# Patient Record
Sex: Female | Born: 1980 | Race: Black or African American | Hispanic: No | Marital: Married | Smoking: Never smoker
Health system: Southern US, Community
[De-identification: ages and names within clinical notes are randomized; demographics above are authoritative.]

## PROBLEM LIST (undated history)

## (undated) DIAGNOSIS — J40 Bronchitis, not specified as acute or chronic: Secondary | ICD-10-CM

## (undated) HISTORY — PX: DILATION AND CURETTAGE OF UTERUS: SHX78

## (undated) HISTORY — PX: CHOLECYSTECTOMY: SHX55

## (undated) HISTORY — PX: HERNIA REPAIR: SHX51

---

## 2002-06-13 ENCOUNTER — Inpatient Hospital Stay (HOSPITAL_COMMUNITY): Admission: AD | Admit: 2002-06-13 | Discharge: 2002-06-13 | Payer: Self-pay | Admitting: *Deleted

## 2002-07-11 ENCOUNTER — Inpatient Hospital Stay (HOSPITAL_COMMUNITY): Admission: AD | Admit: 2002-07-11 | Discharge: 2002-07-11 | Payer: Self-pay | Admitting: Family Medicine

## 2002-07-11 ENCOUNTER — Inpatient Hospital Stay (HOSPITAL_COMMUNITY): Admission: AD | Admit: 2002-07-11 | Discharge: 2002-07-11 | Payer: Self-pay | Admitting: Obstetrics and Gynecology

## 2002-07-25 ENCOUNTER — Inpatient Hospital Stay (HOSPITAL_COMMUNITY): Admission: AD | Admit: 2002-07-25 | Discharge: 2002-07-25 | Payer: Self-pay | Admitting: *Deleted

## 2002-07-31 ENCOUNTER — Inpatient Hospital Stay (HOSPITAL_COMMUNITY): Admission: AD | Admit: 2002-07-31 | Discharge: 2002-08-03 | Payer: Self-pay | Admitting: Family Medicine

## 2002-10-25 ENCOUNTER — Emergency Department (HOSPITAL_COMMUNITY): Admission: EM | Admit: 2002-10-25 | Discharge: 2002-10-25 | Payer: Self-pay | Admitting: Emergency Medicine

## 2003-03-01 ENCOUNTER — Emergency Department (HOSPITAL_COMMUNITY): Admission: EM | Admit: 2003-03-01 | Discharge: 2003-03-02 | Payer: Self-pay | Admitting: Emergency Medicine

## 2003-04-05 ENCOUNTER — Emergency Department (HOSPITAL_COMMUNITY): Admission: EM | Admit: 2003-04-05 | Discharge: 2003-04-06 | Payer: Self-pay | Admitting: Emergency Medicine

## 2003-04-06 ENCOUNTER — Ambulatory Visit (HOSPITAL_COMMUNITY): Admission: RE | Admit: 2003-04-06 | Discharge: 2003-04-06 | Payer: Self-pay

## 2003-04-19 ENCOUNTER — Ambulatory Visit (HOSPITAL_COMMUNITY): Admission: RE | Admit: 2003-04-19 | Discharge: 2003-04-20 | Payer: Self-pay | Admitting: General Surgery

## 2003-04-19 ENCOUNTER — Encounter: Payer: Self-pay | Admitting: General Surgery

## 2003-04-19 ENCOUNTER — Encounter (INDEPENDENT_AMBULATORY_CARE_PROVIDER_SITE_OTHER): Payer: Self-pay | Admitting: *Deleted

## 2004-05-02 ENCOUNTER — Encounter: Admission: RE | Admit: 2004-05-02 | Discharge: 2004-05-02 | Payer: Self-pay | Admitting: Physical Therapy

## 2004-08-22 ENCOUNTER — Emergency Department (HOSPITAL_COMMUNITY): Admission: EM | Admit: 2004-08-22 | Discharge: 2004-08-23 | Payer: Self-pay | Admitting: Emergency Medicine

## 2005-09-24 ENCOUNTER — Emergency Department (HOSPITAL_COMMUNITY): Admission: EM | Admit: 2005-09-24 | Discharge: 2005-09-25 | Payer: Self-pay | Admitting: Emergency Medicine

## 2008-01-06 ENCOUNTER — Ambulatory Visit (HOSPITAL_COMMUNITY): Admission: RE | Admit: 2008-01-06 | Discharge: 2008-01-06 | Payer: Self-pay | Admitting: Obstetrics and Gynecology

## 2008-07-29 ENCOUNTER — Ambulatory Visit: Admission: RE | Admit: 2008-07-29 | Discharge: 2008-07-29 | Payer: Self-pay | Admitting: Obstetrics & Gynecology

## 2008-07-30 ENCOUNTER — Ambulatory Visit (HOSPITAL_COMMUNITY): Admission: RE | Admit: 2008-07-30 | Discharge: 2008-07-30 | Payer: Self-pay | Admitting: Obstetrics and Gynecology

## 2008-07-30 ENCOUNTER — Encounter (INDEPENDENT_AMBULATORY_CARE_PROVIDER_SITE_OTHER): Payer: Self-pay | Admitting: Obstetrics and Gynecology

## 2008-09-14 ENCOUNTER — Inpatient Hospital Stay (HOSPITAL_COMMUNITY): Admission: AD | Admit: 2008-09-14 | Discharge: 2008-09-14 | Payer: Self-pay | Admitting: Obstetrics

## 2009-01-18 ENCOUNTER — Emergency Department (HOSPITAL_BASED_OUTPATIENT_CLINIC_OR_DEPARTMENT_OTHER): Admission: EM | Admit: 2009-01-18 | Discharge: 2009-01-18 | Payer: Self-pay | Admitting: Emergency Medicine

## 2009-03-26 ENCOUNTER — Inpatient Hospital Stay (HOSPITAL_COMMUNITY): Admission: AD | Admit: 2009-03-26 | Discharge: 2009-03-26 | Payer: Self-pay | Admitting: Obstetrics

## 2009-07-29 ENCOUNTER — Inpatient Hospital Stay (HOSPITAL_COMMUNITY): Admission: AD | Admit: 2009-07-29 | Discharge: 2009-07-29 | Payer: Self-pay | Admitting: Obstetrics and Gynecology

## 2009-09-29 ENCOUNTER — Inpatient Hospital Stay (HOSPITAL_COMMUNITY): Admission: AD | Admit: 2009-09-29 | Discharge: 2009-09-30 | Payer: Self-pay | Admitting: Obstetrics and Gynecology

## 2009-09-29 ENCOUNTER — Ambulatory Visit: Payer: Self-pay | Admitting: Advanced Practice Midwife

## 2009-10-01 ENCOUNTER — Inpatient Hospital Stay (HOSPITAL_COMMUNITY): Admission: AD | Admit: 2009-10-01 | Discharge: 2009-10-02 | Payer: Self-pay | Admitting: Obstetrics and Gynecology

## 2009-10-27 ENCOUNTER — Inpatient Hospital Stay (HOSPITAL_COMMUNITY): Admission: AD | Admit: 2009-10-27 | Discharge: 2009-10-27 | Payer: Self-pay | Admitting: Obstetrics and Gynecology

## 2009-11-04 ENCOUNTER — Inpatient Hospital Stay (HOSPITAL_COMMUNITY): Admission: AD | Admit: 2009-11-04 | Discharge: 2009-11-04 | Payer: Self-pay | Admitting: Obstetrics and Gynecology

## 2009-11-07 ENCOUNTER — Inpatient Hospital Stay (HOSPITAL_COMMUNITY): Admission: AD | Admit: 2009-11-07 | Discharge: 2009-11-09 | Payer: Self-pay | Admitting: Obstetrics & Gynecology

## 2010-06-18 ENCOUNTER — Emergency Department (HOSPITAL_BASED_OUTPATIENT_CLINIC_OR_DEPARTMENT_OTHER): Admission: EM | Admit: 2010-06-18 | Discharge: 2010-06-18 | Payer: Self-pay | Admitting: Emergency Medicine

## 2010-07-27 ENCOUNTER — Ambulatory Visit: Payer: Self-pay | Admitting: Diagnostic Radiology

## 2010-07-27 ENCOUNTER — Emergency Department (HOSPITAL_BASED_OUTPATIENT_CLINIC_OR_DEPARTMENT_OTHER): Admission: EM | Admit: 2010-07-27 | Discharge: 2010-07-27 | Payer: Self-pay | Admitting: Emergency Medicine

## 2010-11-28 LAB — BASIC METABOLIC PANEL
BUN: 9 mg/dL (ref 6–23)
Calcium: 9 mg/dL (ref 8.4–10.5)
GFR calc non Af Amer: >60 mL/min (ref >60)
Glucose, Bld: 88 mg/dL (ref 70–99)
Sodium: 146 mEq/L — ABNORMAL HIGH (ref 135–145)

## 2010-11-28 LAB — DIFFERENTIAL
Basophils Absolute: 0.1 10*3/uL (ref 0.0–0.1)
Basophils Relative: 1 % (ref 0–1)
Neutro Abs: 4.2 10*3/uL (ref 1.7–7.7)
Neutrophils Relative %: 55 % (ref 43–77)

## 2010-11-28 LAB — URINALYSIS, ROUTINE W REFLEX MICROSCOPIC
Glucose, UA: NEGATIVE mg/dL
Ketones, ur: NEGATIVE mg/dL
Nitrite: NEGATIVE
Protein, ur: NEGATIVE mg/dL

## 2010-11-28 LAB — D-DIMER, QUANTITATIVE: D-Dimer, Quant: 0.26 ug/mL-FEU (ref 0.00–0.48)

## 2010-11-28 LAB — PREGNANCY, URINE: Preg Test, Ur: NEGATIVE

## 2010-11-28 LAB — CBC
MCHC: 33.5 g/dL (ref 30.0–36.0)
RDW: 11.7 % (ref 11.5–15.5)

## 2010-12-03 LAB — STREP B DNA PROBE

## 2010-12-03 LAB — URINALYSIS, ROUTINE W REFLEX MICROSCOPIC
Bilirubin Urine: NEGATIVE
Hgb urine dipstick: NEGATIVE
Nitrite: NEGATIVE
Nitrite: NEGATIVE
Specific Gravity, Urine: 1.015 (ref 1.005–1.030)
Specific Gravity, Urine: <1.005 — ABNORMAL LOW (ref 1.005–1.030)
Urobilinogen, UA: 0.2 mg/dL (ref 0.0–1.0)
pH: 5.5 (ref 5.0–8.0)

## 2010-12-06 LAB — COMPREHENSIVE METABOLIC PANEL
ALT: 12 U/L (ref 0–35)
AST: 19 U/L (ref 0–37)
Alkaline Phosphatase: 249 U/L — ABNORMAL HIGH (ref 39–117)
CO2: 21 mEq/L (ref 19–32)
Chloride: 108 mEq/L (ref 96–112)
GFR calc Af Amer: >60 mL/min (ref >60)
GFR calc non Af Amer: >60 mL/min (ref >60)
Potassium: 3.6 mEq/L (ref 3.5–5.1)
Sodium: 137 mEq/L (ref 135–145)
Total Bilirubin: 0.8 mg/dL (ref 0.3–1.2)

## 2010-12-06 LAB — URINALYSIS, DIPSTICK ONLY
Ketones, ur: NEGATIVE mg/dL
Leukocytes, UA: NEGATIVE
Protein, ur: NEGATIVE mg/dL
Urobilinogen, UA: 0.2 mg/dL (ref 0.0–1.0)

## 2010-12-06 LAB — CBC
HCT: 36.3 % (ref 36.0–46.0)
MCHC: 33.5 g/dL (ref 30.0–36.0)
MCV: 83.1 fL (ref 78.0–100.0)
MCV: 83.7 fL (ref 78.0–100.0)
MCV: 83.8 fL (ref 78.0–100.0)
Platelets: 180 10*3/uL (ref 150–400)
Platelets: 214 10*3/uL (ref 150–400)
RBC: 4.48 MIL/uL (ref 3.87–5.11)
RDW: 14.3 % (ref 11.5–15.5)
WBC: 10.9 10*3/uL — ABNORMAL HIGH (ref 4.0–10.5)
WBC: 9.5 10*3/uL (ref 4.0–10.5)

## 2010-12-06 LAB — RPR: RPR Ser Ql: NONREACTIVE

## 2010-12-24 LAB — CBC
Hemoglobin: 12.9 g/dL (ref 12.0–15.0)
MCHC: 34.1 g/dL (ref 30.0–36.0)
RBC: 4.52 MIL/uL (ref 3.87–5.11)
WBC: 7.4 10*3/uL (ref 4.0–10.5)

## 2010-12-24 LAB — ABO/RH: ABO/RH(D): B POS

## 2010-12-25 IMAGING — US US TRANSVAGINAL NON-OB
1 series · 14 of 25 positions shown · non-contrast
Comparison: OB ultrasound from 07/29/2008.

CLINICAL DATA: Left lower quadrant pain.

TRANSABDOMINAL AND TRANSVAGINAL ULTRASOUND OF PELVIS
TECHNIQUE: Both transabdominal and transvaginal ultrasound
examinations of the pelvis were performed including evaluation of
the uterus, ovaries, adnexal regions, and pelvic cul-de-sac.

[Series 1: us transvaginal non-ob · 14 of 37 slices shown]
[im 1/37]
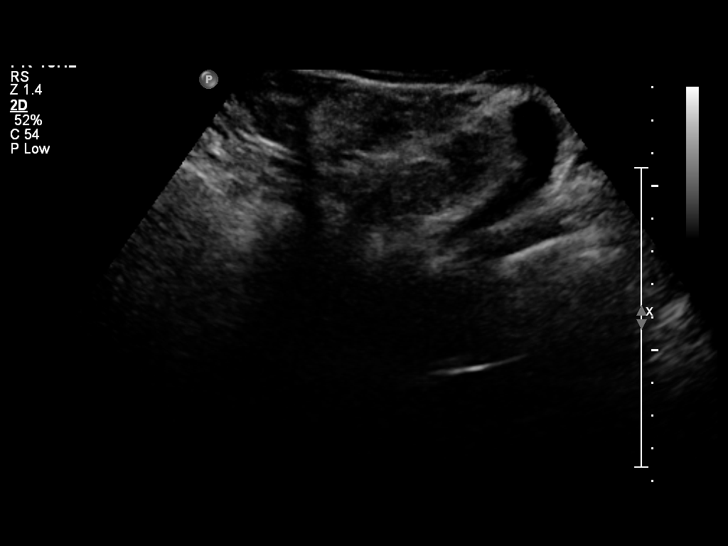
[im 4/37]
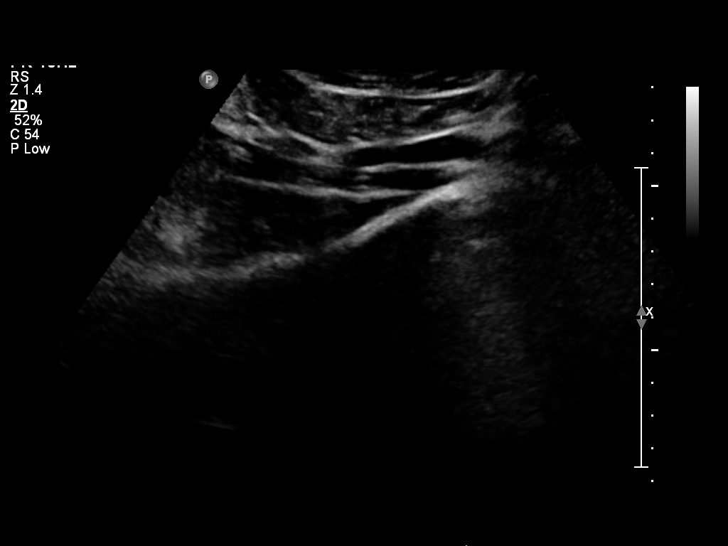
[im 7/37]
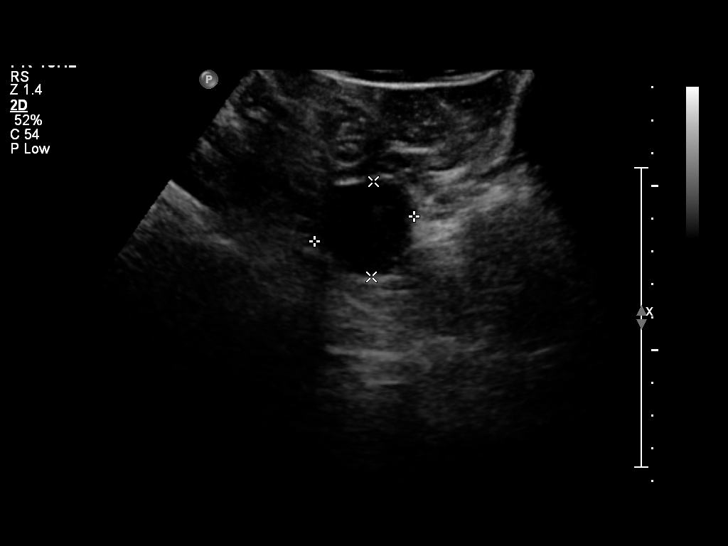
[im 10/37]
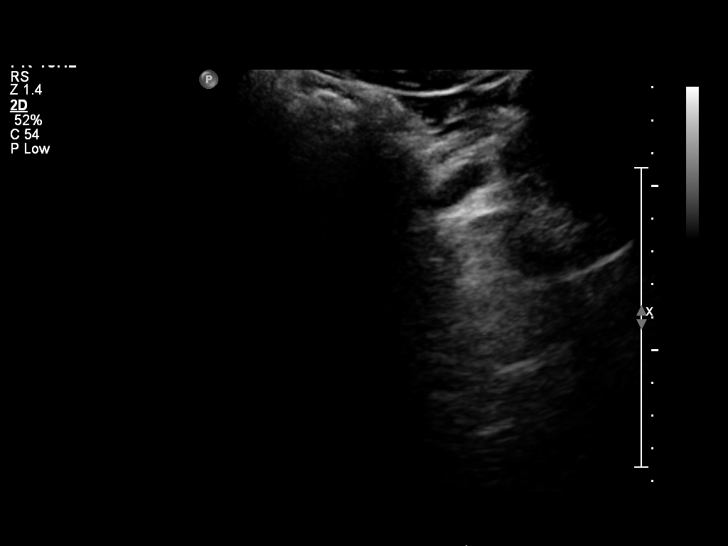
[im 13/37]
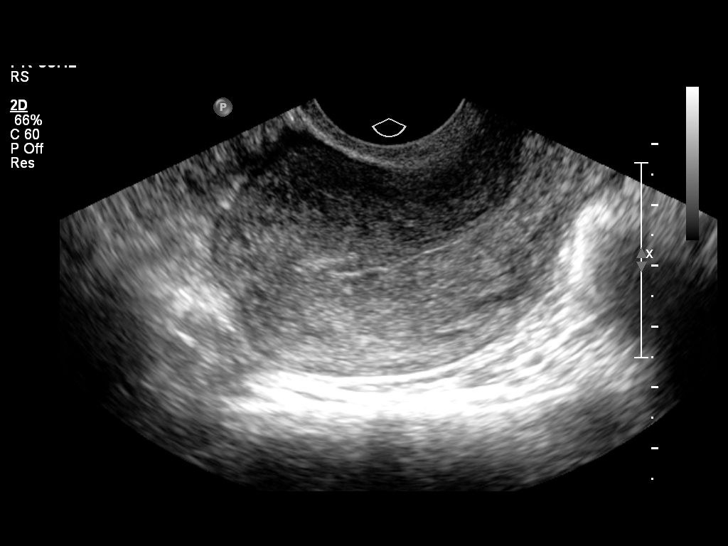
[im 14/37]
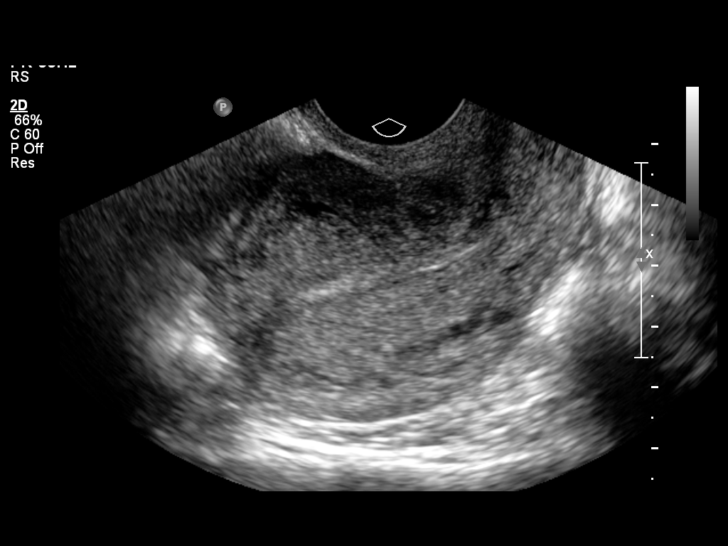
[im 17/37]
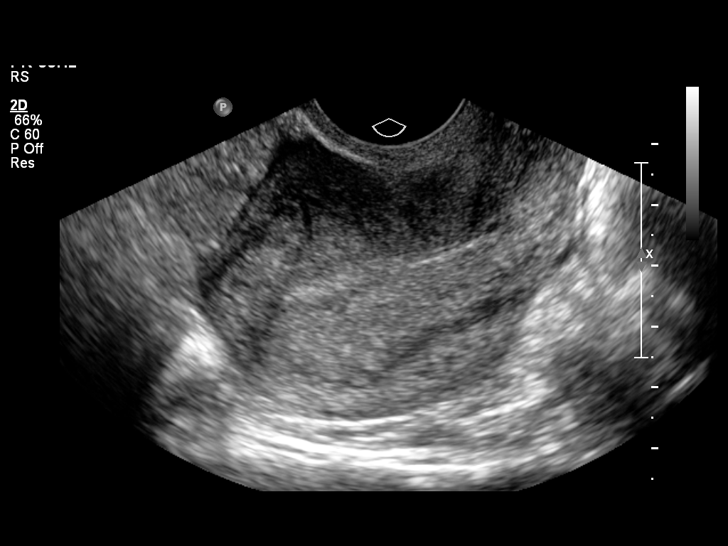
[im 20/37]
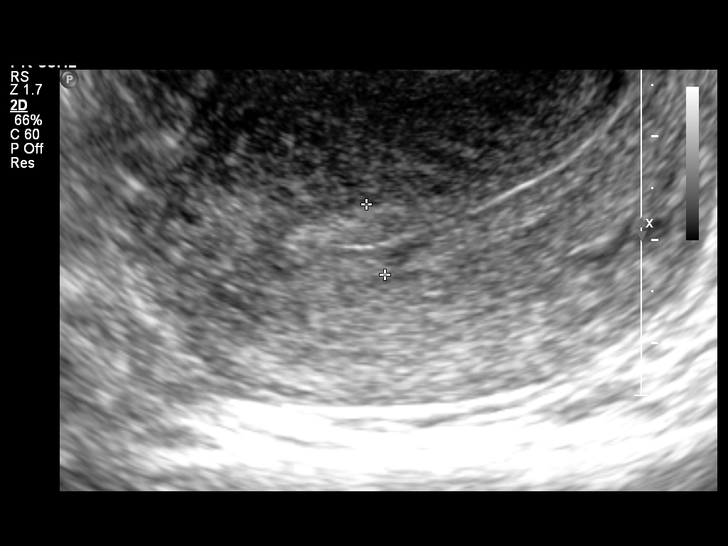
[im 23/37]
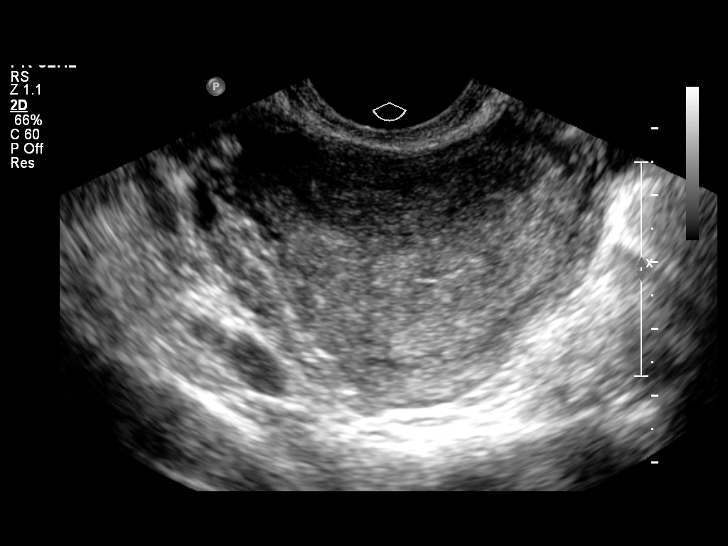
[im 25/37]
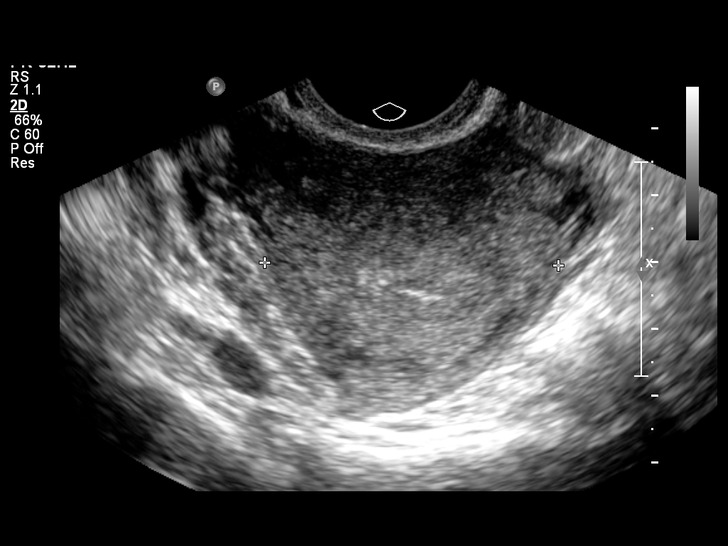
[im 28/37]
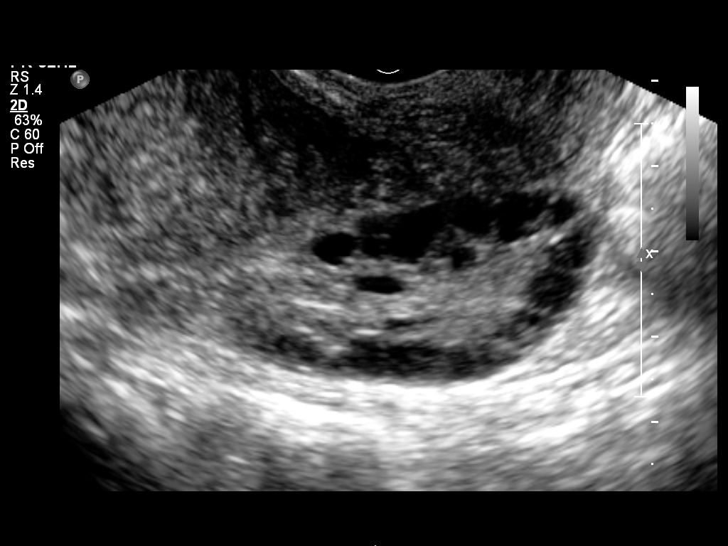
[im 31/37]
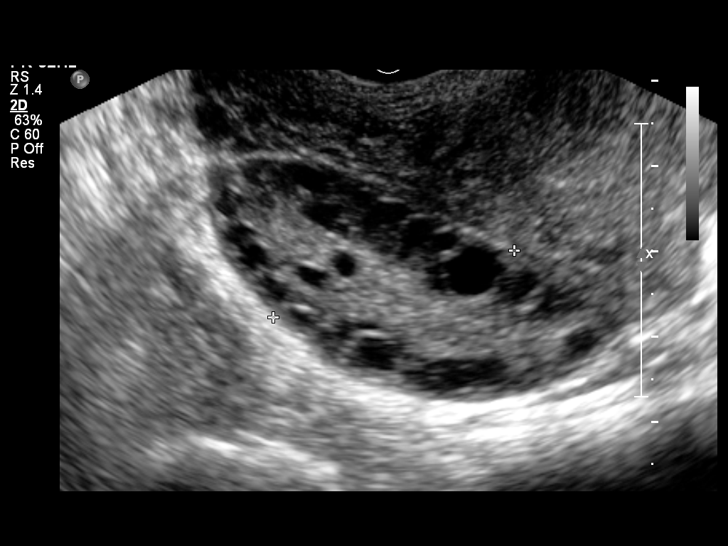
[im 34/37]
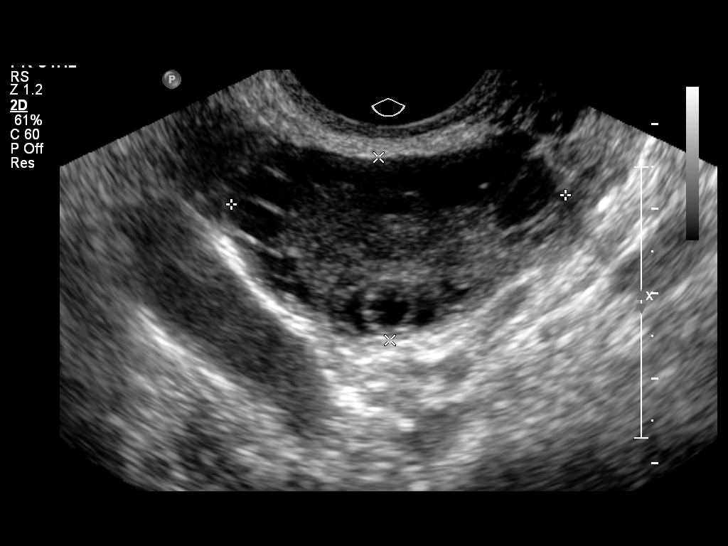
[im 37/37]
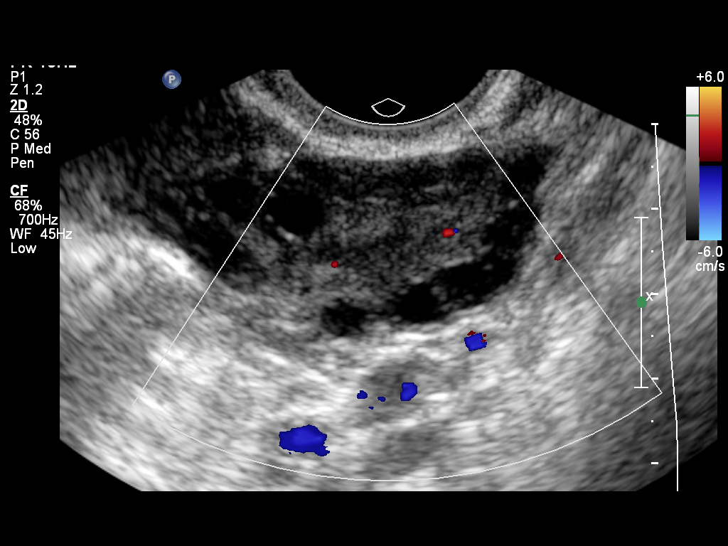

[14 of 25 positions shown; findings below may reference images not displayed]

FINDINGS: The uterus measures 6.9 x 3.9 x 4.4 cm.  Myometrial echotexture is
homogeneous.  No evidence for fibroids.  Endometrial stripe
thickness is 7 mm.

The right ovary measures 5.0 x 2.2 x 2.9 cm.  The left ovary
measures 4.0 x 2.2 x 3.4 cm.  Both ovaries have peripherally
oriented follicles, but show no evidence for increased volume.  No
evidence for free fluid in the cul-de-sac .
IMPRESSION: Normal uterus.

Both ovaries show peripheral orientation of the follicles, but no
increase in ovarian volume.

## 2010-12-26 LAB — URINALYSIS, ROUTINE W REFLEX MICROSCOPIC
Hgb urine dipstick: NEGATIVE
Nitrite: NEGATIVE
Specific Gravity, Urine: 1.01 (ref 1.005–1.030)
Urobilinogen, UA: 0.2 mg/dL (ref 0.0–1.0)

## 2011-01-30 NOTE — Op Note (Signed)
[name], Nancy Black NO.:  [id number]   MEDICAL RECORD NO.:  [id number]          PATIENT TYPE:  AMB   LOCATION:  SDC                           FACILITY:  [facility]   PHYSICIAN:  Lenoard Aden, M.D.DATE OF BIRTH:  [birth year]   DATE OF PROCEDURE:  07/30/2008  DATE OF DISCHARGE:                               OPERATIVE REPORT   PREOPERATIVE DIAGNOSIS:  Missed abortion at 9 weeks.   POSTOPERATIVE DIAGNOSIS:  Missed abortion at 9 weeks.   PROCEDURE:  Suction dilatation and evacuation.   SURGEON:  Lenoard Aden, MD.   ANESTHESIA:  MAC paracervical.   ESTIMATED BLOOD LOSS:  Less than 50 mL.   COMPLICATIONS:  None.   DRAINS:  None.   COUNTS:  Correct.   The patient was taken to recovery in good condition.   SPECIMEN:  Products of conception, sent to Pathology.   BRIEF OPERATIVE NOTE:  After being apprised of risks of anesthesia,  infection, bleeding, injury to abdominal organs, need for repair, and  delayed versus immediate complications to include bowel and bladder  injury, the patient was brought to the operating room where she was  administered IV sedation without difficulty, prepped and draped in usual  sterile fashion, catheterized till the bladder is empty.  Exam under  anesthesia revealed an 8- to 10-week anteflexed uterus and no adnexal  masses.  After achieving adequate anesthesia, Marcaine solution with  diluted Xylocaine solution placed 20 mL total, standard paracervical  block.  Cervix was easily dilated up to #25 [facility] dilator, and an 8-mm  curved suction curette placed.  Suction initiated and products of  conception aspirated without difficulty and confirmed repeat suction and  curettage and a four quadrant method reveals cavity to be empty.  Good  hemostasis noted.  All instruments were removed.  The patient tolerated  the procedure well, transferred to recovery in good condition.     Lenoard Aden, M.D.  Electronically Signed    RJT/MEDQ  D:  07/30/2008  T:  07/31/2008  Job:  [id number]

## 2011-02-02 NOTE — Discharge Summary (Signed)
   NAME:  Nancy Black, PANIK                        ACCOUNT NO.:  [id number]   MEDICAL RECORD NO.:  [id number]                   PATIENT TYPE:  INP   LOCATION:  9121                                 FACILITY:  [facility]   PHYSICIAN:  Tanya S. Shawnie Pons, M.D.                DATE OF BIRTH:  27-Jul-1981   DATE OF ADMISSION:  07/31/2002  DATE OF DISCHARGE:  08/03/2002                                 DISCHARGE SUMMARY   DISCHARGE DIAGNOSES:  53. A 30 year old G1, P1 status post spontaneous vaginal delivery of viable     female infant at term.  2. Anemia.  3. Fever during labor.   DISCHARGE MEDICATIONS:  1. Ibuprofen 600 mg q.6h. p.r.n.  2. Darvocet 100/650 mg q.4h. p.r.n. severe pain.  3. Ortho Lo-Estrin p.o. daily beginning two weeks following delivery.  4. Ferrous sulfate 325 mg daily.   FOLLOW UP:  Return to [facility] in six weeks.   HISTORY OF PRESENT ILLNESS:  Please see full H&P for details.  In brief,  patient is a 30 year old African-American female gravida 1, now para 1 who  presented to [facility] at [redacted] weeks gestational age in active labor.  She had received her prenatal care at [facility] in [facility] and her  prenatal course had been mostly uncomplicated except for some elevated blood  pressure.  Her blood pressure on admission was 155/82.  Laboratories  obtained on admission include WBC 11.7, hemoglobin 12.7, platelets 232,000.  Sodium 140, potassium 3.9, chloride 110, CO2 22, glucose 86, BUN 3,  creatinine 0.6, AST 13, ALT less than 19, alkaline phosphatase 303, total  bilirubin 0.4, LDH 166, and uric acid 4.8.  She was admitted to labor and  delivery for expectant management.   HOSPITAL COURSE:  She did well.  She received Amnioinfusion during labor  secondary to meconium stained fluid.  She received an epidural during labor  for pain.  Fetal heart tracing at times was noted to have decreased  reactivity, though fetal heart rate remained stable.   Her labor course was  further complicated by fever to 100.7 and she was started on IV ampicillin  and IV gentamicin for possible chorioamnionitis.  On August 01, 2002 she  delivered a viable female infant with Apgars of 5 and 9.  A midline episiotomy  was performed to assist with delivery.  Her postpartum course was  uncomplicated.  She remained afebrile.   DISCHARGE INSTRUCTIONS:  She was discharged to home in stable condition.     Tamika J. Lazarus Salines, M.D.                      Shelbie Proctor. Shawnie Pons, M.D.    Nadara Eaton  D:  01/21/2003  T:  01/21/2003  Job:  [id number]

## 2011-02-02 NOTE — Op Note (Signed)
NAME:  Nancy Black, Nancy Black                        ACCOUNT NO.:  [id number]   MEDICAL RECORD NO.:  [id number]                   PATIENT TYPE:  OIB   LOCATION:  NA                                   FACILITY:  [facility]   PHYSICIAN:  Gita Kudo, M.D.              DATE OF BIRTH:  08/24/1981   DATE OF PROCEDURE:  04/19/2003  DATE OF DISCHARGE:                                 OPERATIVE REPORT   PREOPERATIVE DIAGNOSIS:  Gallstones.   POSTOPERATIVE DIAGNOSIS:  Gallstones, normal-appearing cholangiogram.   OPERATION PERFORMED:  Laparoscopic cholecystectomy with intraoperative  cholangiogram.   SURGEON:  Gita Kudo, M.D.   ASSISTANT:  Sharlet Salina T. Hoxworth, M.D.   ANESTHESIA:  General endotracheal.   INDICATIONS FOR PROCEDURE:  The patient is a 29 year old female with bouts  of abdominal pain and gallbladder ultrasound showed stones.  Alkaline  phosphatase slightly elevated.   OPERATIVE FINDINGS:  The gallbladder was thin-walled, had some filmy  adhesions that appeared congenital rather than inflammatory.  The cystic  duct and artery looked normal in size.  The cholangiogram appeared normal.   DESCRIPTION OF PROCEDURE:  Under satisfactory general endotracheal  anesthesia, having received 1.0g Ancef preop, the patient's abdomen prepped  and draped in a standard fashion.  Through Marcaine infiltrated skin  incisions, the ports were placed.  In the midline below the umbilicus, the  peritoneum was entered through the midline and controlled with a figure-of-  eight sutures.  Operating Hasson port inserted and secured and good CO2  pneumoperitoneum established.  Under direct visualization, two #5 ports were  placed laterally and a second #10 port medially.  Graspers in the lateral  port gave good exposure and operating through the medial port, I carefully  dissected down all the adhesions and then circumferentially dissected the  cystic duct and artery.  When certain of the anatomy,  the cystic duct was  clipped close to the gallbladder and the cystic artery controlled with  multiple clips.  An incision made in the cystic duct and cholangiogram  catheter placed and x-rays obtained.  No obstruction or defects.  Cath  removed and the duct controlled with three clips distally and transected as  was the artery.  Gallbladder then removed from the liver bed using cautery  for dissection and hemostasis.  The operative bed was checked for hemostasis  by cautery, lavaged with saline and suctioned dry.   Camera moved to the upper port and a large grasper placed in the umbilical  port and used to retrieve the gallbladder intact, without spillage or  complication.  Then the operative site again checked, lavaged, suctioned  dry.  Ports were removed under direct vision and the CO2 released.  Midline  closed with a previous figure-of-eight and a second  interrupted 0 Vicryl suture.  Subcu approximated with 4-0 Vicryl and Steri-  Strips for the skin.  Sterile absorbent dressings applied and the patient  went to recovery room from the operating room in good condition without  complication.                                                Gita Kudo, M.D.    MRL/MEDQ  D:  04/19/2003  T:  04/19/2003  Job:  [id number]

## 2011-06-19 LAB — CBC
MCV: 84
RBC: 4.72
WBC: 10.8 — ABNORMAL HIGH

## 2011-06-21 LAB — URINALYSIS, ROUTINE W REFLEX MICROSCOPIC
Bilirubin Urine: NEGATIVE
Glucose, UA: NEGATIVE mg/dL
Hgb urine dipstick: NEGATIVE
Ketones, ur: NEGATIVE mg/dL
Nitrite: NEGATIVE
Protein, ur: NEGATIVE mg/dL
Specific Gravity, Urine: 1.02 (ref 1.005–1.030)
Urobilinogen, UA: 0.2 mg/dL (ref 0.0–1.0)
pH: 6 (ref 5.0–8.0)

## 2011-06-21 LAB — CBC
HCT: 38.2 % (ref 36.0–46.0)
Hemoglobin: 12.9 g/dL (ref 12.0–15.0)
MCHC: 33.9 g/dL (ref 30.0–36.0)
MCV: 84.2 fL (ref 78.0–100.0)
Platelets: 259 K/uL (ref 150–400)
RBC: 4.54 MIL/uL (ref 3.87–5.11)
RDW: 12.6 % (ref 11.5–15.5)
WBC: 8.9 K/uL (ref 4.0–10.5)

## 2011-06-21 LAB — URINE CULTURE: Colony Count: 35000

## 2015-06-17 ENCOUNTER — Encounter (HOSPITAL_COMMUNITY): Payer: Self-pay | Admitting: Emergency Medicine

## 2015-06-17 ENCOUNTER — Emergency Department (INDEPENDENT_AMBULATORY_CARE_PROVIDER_SITE_OTHER)
Admission: EM | Admit: 2015-06-17 | Discharge: 2015-06-17 | Disposition: A | Discharge status: Home / Self Care | Payer: Medicaid Other | Source: Home / Self Care | Attending: Emergency Medicine | Admitting: Emergency Medicine

## 2015-06-17 DIAGNOSIS — L509 Urticaria, unspecified: Secondary | ICD-10-CM

## 2015-06-17 DIAGNOSIS — L503 Dermatographic urticaria: Secondary | ICD-10-CM

## 2015-06-17 MED ORDER — HYDROXYZINE HCL 25 MG PO TABS: 25.0000 mg | ORAL_TABLET | Freq: Four times a day (QID) | ORAL | Status: DC | PRN

## 2015-06-17 MED ORDER — METHYLPREDNISOLONE ACETATE 80 MG/ML IJ SUSP
INTRAMUSCULAR | Status: AC
  Filled 2015-06-17: qty 1

## 2015-06-17 MED ORDER — CETIRIZINE HCL 10 MG PO TABS: 10.0000 mg | ORAL_TABLET | Freq: Every day | ORAL | Status: DC

## 2015-06-17 MED ORDER — METHYLPREDNISOLONE ACETATE 80 MG/ML IJ SUSP
80.0000 mg | Freq: Once | INTRAMUSCULAR | Status: AC
  Administered 2015-06-17: 80 mg via INTRAMUSCULAR

## 2015-06-17 NOTE — ED Notes (Signed)
Hives intermittently for 3 -4 weeks.  Unsure of cause.  Very little relief from benadryl.

## 2015-06-17 NOTE — ED Provider Notes (Signed)
CSN: [id number]     Arrival date & time 06/17/15  1851 History   First MD Initiated Contact with Patient 06/17/15 1940     Chief Complaint  Patient presents with  . Urticaria   (Consider location/radiation/quality/duration/timing/severity/associated sxs/prior Treatment) HPI  She is a 34 year old woman here for evaluation of hives. She states for the last 3 or 4 weeks she has had intermittent episodes of hives.  She states they'll pop up and then go away. They seem to be triggered by stress. She called her doctor, who recommended that she stop the Carafate. She had been on Carafate several months ago for stress-related GI issues. This medicine was refilled about a week before the hives started. She is not taking the Carafate for over a week without improvement. She states anytime she scratches her skin, hives will pop up. She has taken Benadryl without much improvement.  History reviewed. No pertinent past medical history. Past Surgical History  Procedure Laterality Date  . Hernia repair    . Cholecystectomy     No family history on file. Social History  Substance Use Topics  . Smoking status: Never Smoker   . Smokeless tobacco: None  . Alcohol Use: Yes   OB History    No data available     Review of Systems As in history of present illness Allergies  Codeine  Home Medications   Prior to Admission medications   Medication Sig Start Date End Date Taking? Authorizing Provider  cetirizine (ZYRTEC) 10 MG tablet Take 1 tablet (10 mg total) by mouth daily. 06/17/15   Charm Rings, MD  hydrOXYzine (ATARAX/VISTARIL) 25 MG tablet Take 1 tablet (25 mg total) by mouth every 6 (six) hours as needed for itching. 06/17/15   Charm Rings, MD   Meds Ordered and Administered this Visit   Medications  methylPREDNISolone acetate (DEPO-MEDROL) injection 80 mg (not administered)    BP 148/93 mmHg  Pulse 73  Temp(Src) 98.7 F (37.1 C) (Oral)  Resp 16  SpO2 100%  LMP 06/10/2015 No data  found.   Physical Exam  Constitutional: She is oriented to person, place, and time. She appears well-developed and well-nourished. No distress.  Cardiovascular: Normal rate.   Pulmonary/Chest: Effort normal.  Neurological: She is alert and oriented to person, place, and time.  Skin:  Hives on upper back. She does develop redness and hives with scratching her skin.    ED Course  Procedures (including critical care time)  Labs Review Labs Reviewed - No data to display  Imaging Review No results found.   MDM   1. Urticaria   2. Dermatographia    Depo-Medrol given here. Treat with daily Zyrtec. Hydroxyzine as needed for itching. Given that her symptoms have not improved despite being off the Carafate for over a week, I highly doubt this is an allergic reaction to the Carafate. I told her it is okay to restart it. Follow-up here or with PCP if not improving.    Charm Rings, MD 06/17/15 2004

## 2015-06-17 NOTE — Discharge Instructions (Signed)
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 This is where your skin reacts to mild trauma, such as scratching, with hives. We gave you a steroid shot today to help. Please start taking cetirizine daily to control your symptoms long-term. You can take hydroxyzine every 6 hours as needed for itching. You can go ahead and restart the Carafate for your stomach issues. Please follow-up here or with your primary care doctor if things are not improving.

## 2015-08-25 ENCOUNTER — Encounter: Payer: Self-pay | Admitting: Gastroenterology

## 2015-11-01 ENCOUNTER — Ambulatory Visit: Payer: Medicaid Other | Admitting: Gastroenterology

## 2016-08-18 ENCOUNTER — Encounter (HOSPITAL_COMMUNITY): Payer: Self-pay | Admitting: *Deleted

## 2016-08-18 ENCOUNTER — Ambulatory Visit (HOSPITAL_COMMUNITY)
Admission: EM | Admit: 2016-08-18 | Discharge: 2016-08-18 | Disposition: A | Discharge status: Home / Self Care | Payer: Self-pay | Attending: Emergency Medicine | Admitting: Emergency Medicine

## 2016-08-18 DIAGNOSIS — J018 Other acute sinusitis: Secondary | ICD-10-CM

## 2016-08-18 DIAGNOSIS — R05 Cough: Secondary | ICD-10-CM

## 2016-08-18 DIAGNOSIS — R059 Cough, unspecified: Secondary | ICD-10-CM

## 2016-08-18 HISTORY — DX: Bronchitis, not specified as acute or chronic: J40

## 2016-08-18 MED ORDER — BENZONATATE 100 MG PO CAPS: 100.0000 mg | ORAL_CAPSULE | Freq: Three times a day (TID) | ORAL | 0 refills | Status: AC

## 2016-08-18 MED ORDER — ALBUTEROL SULFATE HFA 108 (90 BASE) MCG/ACT IN AERS: 2.0000 | INHALATION_SPRAY | RESPIRATORY_TRACT | 0 refills | Status: AC | PRN

## 2016-08-18 MED ORDER — PREDNISONE 50 MG PO TABS: ORAL_TABLET | ORAL | 0 refills | Status: AC

## 2016-08-18 NOTE — ED Provider Notes (Signed)
[facility]    CSN: [id number] Arrival date & time: 08/18/16  1300     History   Chief Complaint Chief Complaint  Patient presents with  . Cough    HPI Nancy Black is a 35 y.o. female.   HPI  She is a 35 year old woman here for evaluation of cough. She reports a 10 day history of cough. Initially, it was more of a head cold that she treated with Mucinex. In the last week, the head cold symptoms have resolved. However, she has a persistent cough and wheezing. These really only occur at night. It makes her short of breath. She denies any fevers. She does have an albuterol inhaler from a bronchitis episode last year. She states it provides minimal relief. During the day, she reports symptoms are minimal.  Past Medical History:  Diagnosis Date  . Bronchitis     There are no active problems to display for this patient.   Past Surgical History:  Procedure Laterality Date  . CHOLECYSTECTOMY    . DILATION AND CURETTAGE OF UTERUS    . HERNIA REPAIR      OB History    No data available       Home Medications    Prior to Admission medications   Medication Sig Start Date End Date Taking? Authorizing Provider  albuterol (PROVENTIL HFA;VENTOLIN HFA) 108 (90 Base) MCG/ACT inhaler Inhale 2 puffs into the lungs every 4 (four) hours as needed for wheezing or shortness of breath. 08/18/16   Charm RingsErin J Lazette Estala, MD  benzonatate (TESSALON) 100 MG capsule Take 1 capsule (100 mg total) by mouth every 8 (eight) hours. 08/18/16   Charm RingsErin J Tamila Gaulin, MD  predniSONE (DELTASONE) 50 MG tablet Take 1 pill daily for 5 days. 08/18/16   Charm RingsErin J Kahlin Mark, MD    Family History No family history on file.  Social History Social History  Substance Use Topics  . Smoking status: Never Smoker  . Smokeless tobacco: Never Used  . Alcohol use Yes     Comment: occasionally     Allergies   Codeine   Review of Systems Review of Systems As in history of present illness  Physical Exam Triage  Vital Signs ED Triage Vitals [08/18/16 1344]  Enc Vitals Group     BP 131/91     Pulse Rate 86     Resp 16     Temp 98.4 F (36.9 C)     Temp Source Oral     SpO2 100 %     Weight      Height      Head Circumference      Peak Flow      Pain Score      Pain Loc      Pain Edu?      Excl. in GC?    No data found.   Updated Vital Signs BP 131/91   Pulse 86   Temp 98.4 F (36.9 C) (Oral)   Resp 16   LMP 06/11/2016 Comment: Irregular menses normal per pt; states not pregnant  SpO2 100%   Visual Acuity Right Eye Distance:   Left Eye Distance:   Bilateral Distance:    Right Eye Near:   Left Eye Near:    Bilateral Near:     Physical Exam  Constitutional: She is oriented to person, place, and time. She appears well-developed and well-nourished. No distress.  HENT:  Mouth/Throat: No oropharyngeal exudate.  Nasal mucosa is erythematous and slightly boggy.  No sinus tenderness.  Neck: Neck supple.  Cardiovascular: Normal rate, regular rhythm and normal heart sounds.   No murmur heard. Pulmonary/Chest: Effort normal and breath sounds normal. No respiratory distress. She has no wheezes. She has no rales.  Neurological: She is alert and oriented to person, place, and time.     UC Treatments / Results  Labs (all labs ordered are listed, but only abnormal results are displayed) Labs Reviewed - No data to display  EKG  EKG Interpretation None       Radiology No results found.  Procedures Procedures (including critical care time)  Medications Ordered in UC Medications - No data to display   Initial Impression / Assessment and Plan / UC Course  I have reviewed the triage vital signs and the nursing notes.  Pertinent labs & imaging results that were available during my care of the patient were reviewed by me and considered in my medical decision making (see chart for details).  Clinical Course     I suspect her symptoms are coming from sinus drainage as  they really only occur at night. We'll treat with prednisone for 5 days. Tessalon as needed for cough. Albuterol as needed for wheezing. Follow-up if not improving in 2-3 days.  Final Clinical Impressions(s) / UC Diagnoses   Final diagnoses:  Acute non-recurrent sinusitis of other sinus  Cough    New Prescriptions New Prescriptions   ALBUTEROL (PROVENTIL HFA;VENTOLIN HFA) 108 (90 BASE) MCG/ACT INHALER    Inhale 2 puffs into the lungs every 4 (four) hours as needed for wheezing or shortness of breath.   BENZONATATE (TESSALON) 100 MG CAPSULE    Take 1 capsule (100 mg total) by mouth every 8 (eight) hours.   PREDNISONE (DELTASONE) 50 MG TABLET    Take 1 pill daily for 5 days.     Charm RingsErin J Aleira Deiter, MD 08/18/16 [phone]

## 2016-08-18 NOTE — ED Triage Notes (Signed)
Describes SOB; speaking complete sentences without difficulty or any labored breathing.

## 2016-08-18 NOTE — Discharge Instructions (Signed)
This is all coming from your sinuses.  Take prednisone daily for 5 days. Use the Tessalon 3 times a day as needed for cough. Use the albuterol inhaler as needed for wheezing. You should see improvement in the next 2-3 days. Follow-up if not improving or getting worse.
